# Patient Record
Sex: Male | Born: 1975 | Race: White | Hispanic: No | Marital: Married | Smoking: Never smoker
Health system: Southern US, Community
[De-identification: ages and names within clinical notes are randomized; demographics above are authoritative.]

---

## 2003-12-19 ENCOUNTER — Ambulatory Visit (HOSPITAL_COMMUNITY): Admission: RE | Admit: 2003-12-19 | Discharge: 2003-12-19 | Payer: Self-pay | Admitting: Internal Medicine

## 2004-06-13 ENCOUNTER — Ambulatory Visit (HOSPITAL_COMMUNITY): Admission: RE | Admit: 2004-06-13 | Discharge: 2004-06-13 | Payer: Self-pay | Admitting: Family Medicine

## 2008-03-30 ENCOUNTER — Emergency Department (HOSPITAL_COMMUNITY): Admission: EM | Admit: 2008-03-30 | Discharge: 2008-03-30 | Payer: Self-pay | Admitting: Emergency Medicine

## 2010-08-25 ENCOUNTER — Encounter: Payer: Self-pay | Admitting: Internal Medicine

## 2010-08-26 ENCOUNTER — Encounter: Payer: Self-pay | Admitting: Family Medicine

## 2012-12-30 ENCOUNTER — Telehealth: Payer: Self-pay

## 2012-12-30 NOTE — Telephone Encounter (Signed)
Denise from [facility] called stating that patient needs a signed order for the MRI of the right knee before they see him. Please fax it to [phone]. Thanks

## 2013-01-12 ENCOUNTER — Telehealth: Payer: Self-pay

## 2013-01-12 NOTE — Telephone Encounter (Signed)
Error! Placed message in [place] w/c

## 2018-01-04 ENCOUNTER — Emergency Department (HOSPITAL_COMMUNITY)
Admission: EM | Admit: 2018-01-04 | Discharge: 2018-01-05 | Disposition: A | Discharge status: Home / Self Care | Payer: BLUE CROSS/BLUE SHIELD | Attending: Emergency Medicine | Admitting: Emergency Medicine

## 2018-01-04 ENCOUNTER — Other Ambulatory Visit: Payer: Self-pay

## 2018-01-04 ENCOUNTER — Encounter (HOSPITAL_COMMUNITY): Payer: Self-pay | Admitting: Emergency Medicine

## 2018-01-04 DIAGNOSIS — S01112A Laceration without foreign body of left eyelid and periocular area, initial encounter: Secondary | ICD-10-CM

## 2018-01-04 DIAGNOSIS — W269XXA Contact with unspecified sharp object(s), initial encounter: Secondary | ICD-10-CM | POA: Diagnosis not present

## 2018-01-04 DIAGNOSIS — S0993XA Unspecified injury of face, initial encounter: Secondary | ICD-10-CM | POA: Diagnosis present

## 2018-01-04 DIAGNOSIS — Y939 Activity, unspecified: Secondary | ICD-10-CM | POA: Diagnosis not present

## 2018-01-04 DIAGNOSIS — Y999 Unspecified external cause status: Secondary | ICD-10-CM | POA: Diagnosis not present

## 2018-01-04 DIAGNOSIS — Y929 Unspecified place or not applicable: Secondary | ICD-10-CM | POA: Insufficient documentation

## 2018-01-04 NOTE — ED Triage Notes (Signed)
Laceration to lt eye lid, denies any visual loss.  Cut by a piece of electrical wire

## 2018-01-05 MED ORDER — LIDOCAINE-EPINEPHRINE-TETRACAINE (LET) SOLUTION
3.0000 mL | Freq: Once | NASAL | Status: AC
  Administered 2018-01-05: 3 mL via TOPICAL
  Filled 2018-01-05: qty 3

## 2018-01-05 MED ORDER — LIDOCAINE-EPINEPHRINE-TETRACAINE (LET) SOLUTION
NASAL | Status: AC
  Filled 2018-01-05: qty 3

## 2018-01-05 MED ORDER — LIDOCAINE HCL (PF) 2 % IJ SOLN
INTRAMUSCULAR | Status: AC
  Filled 2018-01-05: qty 10

## 2018-01-05 NOTE — ED Provider Notes (Signed)
[facility]ENCY DEPARTMENT Provider Note   CSN: [id number] Arrival date & time: 01/04/18  2030     History   Chief Complaint Chief Complaint  Patient presents with  . Laceration    HPI Andre Simon is a 42 y.o. male presenting with laceration to his mid left upper eyelid occurring prior to arrival.  He is an Personnel officerelectrician and describes a bare wire attached to a wall but protruding outward lacerated the eyelid when he walked too closely to the wire.  He denies eyeglobe injury, pain or reduced visual acuity.  The wound bled moderately but is now well controlled after pressure application.  He is current with his tetanus.  The history is provided by the patient.    History reviewed. No pertinent past medical history.  There are no active problems to display for this patient.   History reviewed. No pertinent surgical history.      Home Medications    Prior to Admission medications   Not on File    Family History No family history on file.  Social History Social History   Tobacco Use  . Smoking status: Never Smoker  . Smokeless tobacco: Never Used  Substance Use Topics  . Alcohol use: Not Currently  . Drug use: Not Currently     Allergies   Penicillins   Review of Systems Review of Systems  Constitutional: Negative.   Eyes: Negative for pain, redness and visual disturbance.  Skin: Positive for wound.  Neurological: Negative.   All other systems reviewed and are negative.    Physical Exam Updated Vital Signs BP 115/78 (BP Location: Right Arm)   Pulse 84   Resp 18   Ht 6' (1.829 m)   Wt 99.8 kg (220 lb)   SpO2 100%   BMI 29.84 kg/m   Physical Exam  Constitutional: He is oriented to person, place, and time. He appears well-developed and well-nourished.  HENT:  Head: Normocephalic.  Eyes: Pupils are equal, round, and reactive to light. Conjunctivae and EOM are normal.  1.5 cm linear laceration at the crease of his left upper eyelid.   Hemostatic.  Wound approximates well when open, but closure of the eyelid widens the wound edges.  Obicularis oris muscle visualized, but intact. No globe or eyelid margin injury.  Cardiovascular: Normal rate.  Pulmonary/Chest: Effort normal.  Musculoskeletal: He exhibits tenderness.  Neurological: He is alert and oriented to person, place, and time. No sensory deficit.  Skin: Laceration noted.     ED Treatments / Results  Labs (all labs ordered are listed, but only abnormal results are displayed) Labs Reviewed - No data to display  EKG None  Radiology No results found.  Procedures Procedures (including critical care time)  LACERATION REPAIR Performed by: Burgess AmorIDOL, Warden Buffa Authorized by: Burgess AmorIDOL, Annarose Ouellet Consent: Verbal consent obtained. Risks and benefits: risks, benefits and alternatives were discussed Consent given by: patient Patient identity confirmed: provided demographic data Prepped and Draped in normal sterile fashion Wound explored  Laceration Location: left upper eyelid  Laceration Length: 1.5cm  No Foreign Bodies seen or palpated  Anesthesia: Topical LET applied with only partial anesthesia obtained.  Attempted supraorbital nerve block with no improvement of pain relief.  Additional LET applied.    Local anesthetic: lidocaine 2% without epinephrine  Anesthetic total: 0.5 ml  Irrigation method: syringe Amount of cleaning: standard  Skin closure: vicryl Rapide 6-0  Number of sutures: 3  Technique: simple interrupted. Applied sutures to outer 1/2 of the wound which  adequately approximated the medial wound which remained sensate.  Patient tolerance: Patient tolerated the procedure well with no immediate complications.   Medications Ordered in ED Medications  lidocaine-EPINEPHrine-tetracaine (LET) solution (3 mLs Topical Given 01/05/18 0012)     Initial Impression / Assessment and Plan / ED Course  I have reviewed the triage vital signs and the nursing  notes.  Pertinent labs & imaging results that were available during my care of the patient were reviewed by me and considered in my medical decision making (see chart for details).     Wound care and f/u discussed. Prn removal of sutures in 5 days if pt desires. He is aware these are absorbable.  No globe injury or complaint of vision changes or deficit. Prn f/u anticipated.  Pt was seen by Dr. Bebe Shaggy during this encounter.  Final Clinical Impressions(s) / ED Diagnoses   Final diagnoses:  Left eyelid laceration, initial encounter    ED Discharge Orders    None       Victoriano Lain 01/05/18 1417    Zadie Rhine, MD 01/06/18 [phone]

## 2018-02-16 ENCOUNTER — Other Ambulatory Visit: Payer: Self-pay | Admitting: Family Medicine

## 2018-02-16 ENCOUNTER — Ambulatory Visit (HOSPITAL_COMMUNITY)
Admission: EM | Admit: 2018-02-16 | Discharge: 2018-02-16 | Discharge status: Absent without leave | Payer: BLUE CROSS/BLUE SHIELD

## 2018-02-16 ENCOUNTER — Ambulatory Visit: Payer: Self-pay

## 2018-02-16 DIAGNOSIS — M25571 Pain in right ankle and joints of right foot: Secondary | ICD-10-CM

## 2018-02-16 DIAGNOSIS — M25511 Pain in right shoulder: Secondary | ICD-10-CM

## 2019-05-30 IMAGING — DX DG SHOULDER 2+V*R*
4 series · 4 of 4 positions shown · non-contrast
Comparison: None.

CLINICAL DATA: 42-year-old who fell yesterday and injured the RIGHT
shoulder and the RIGHT ankle. Initial encounter.

EXAM:
RIGHT SHOULDER - 2+ VIEW

[shoulder ap (1 of 2)]
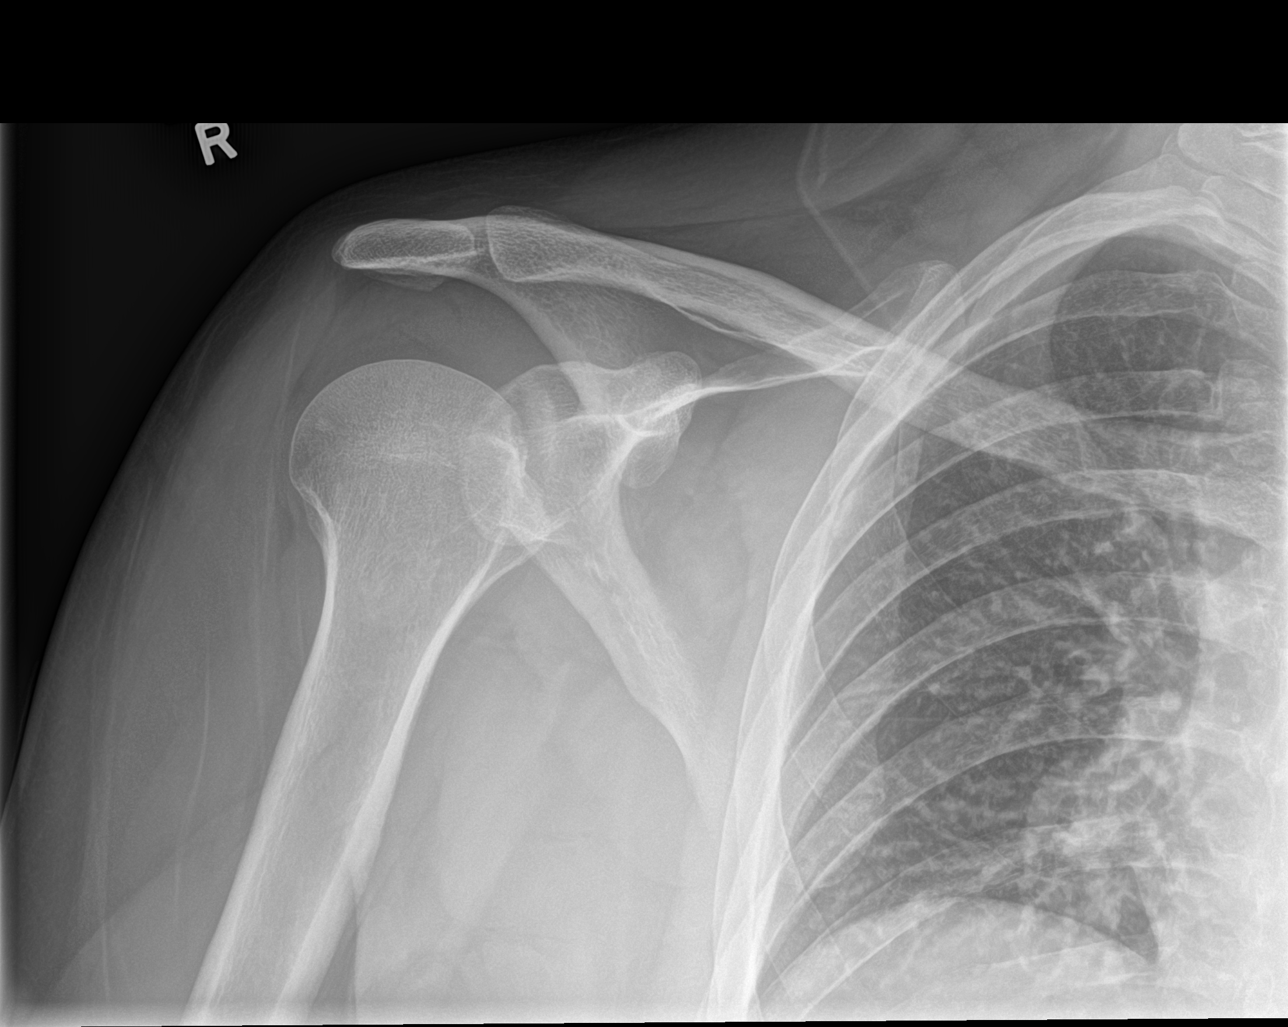

[shoulder ap (2 of 2)]
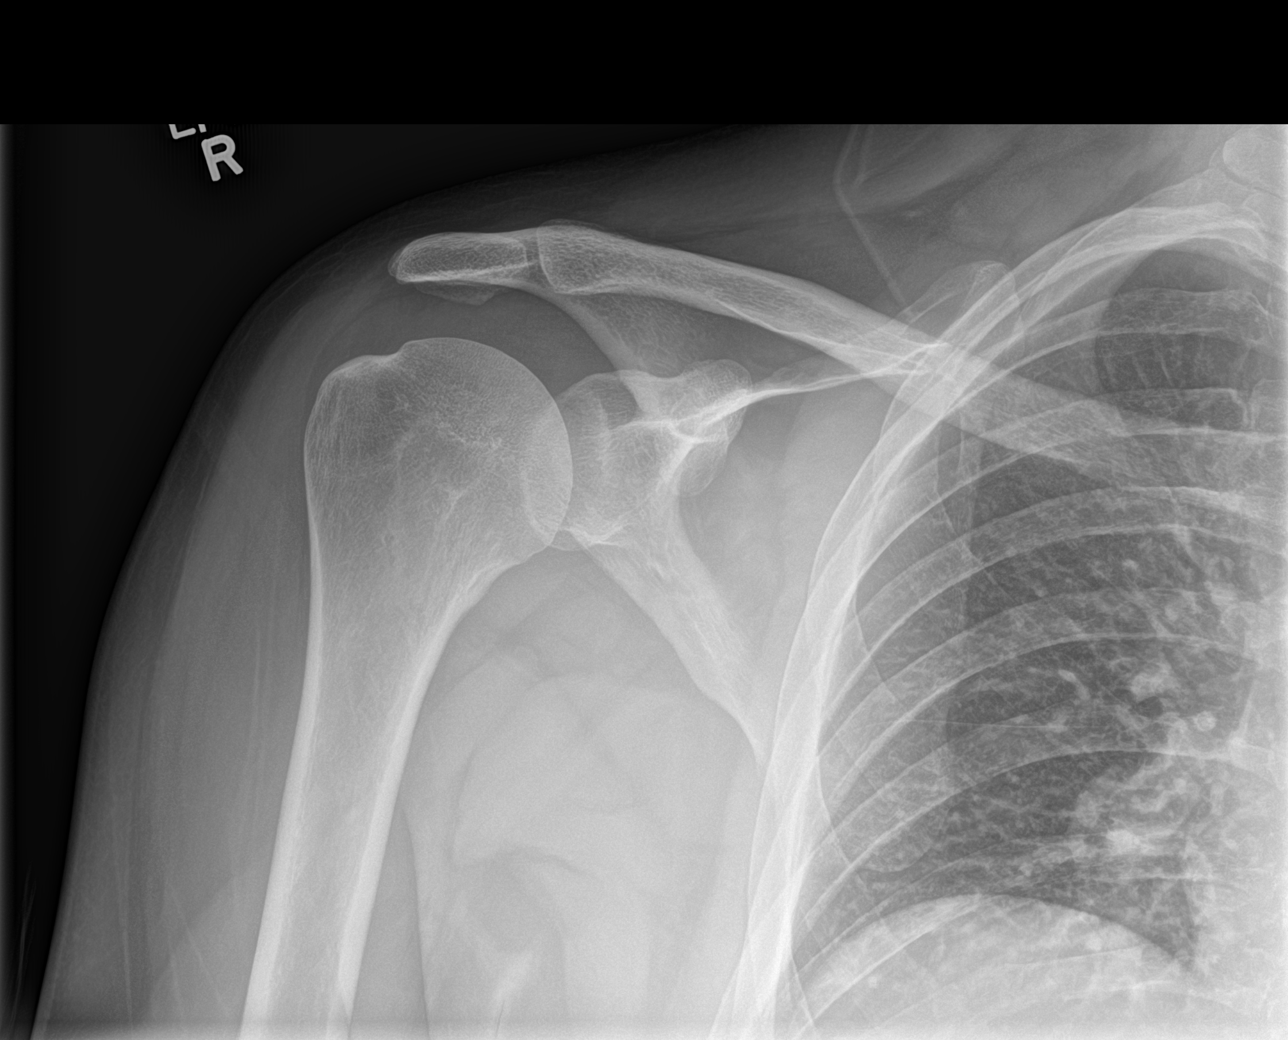

[shoulder y-view]
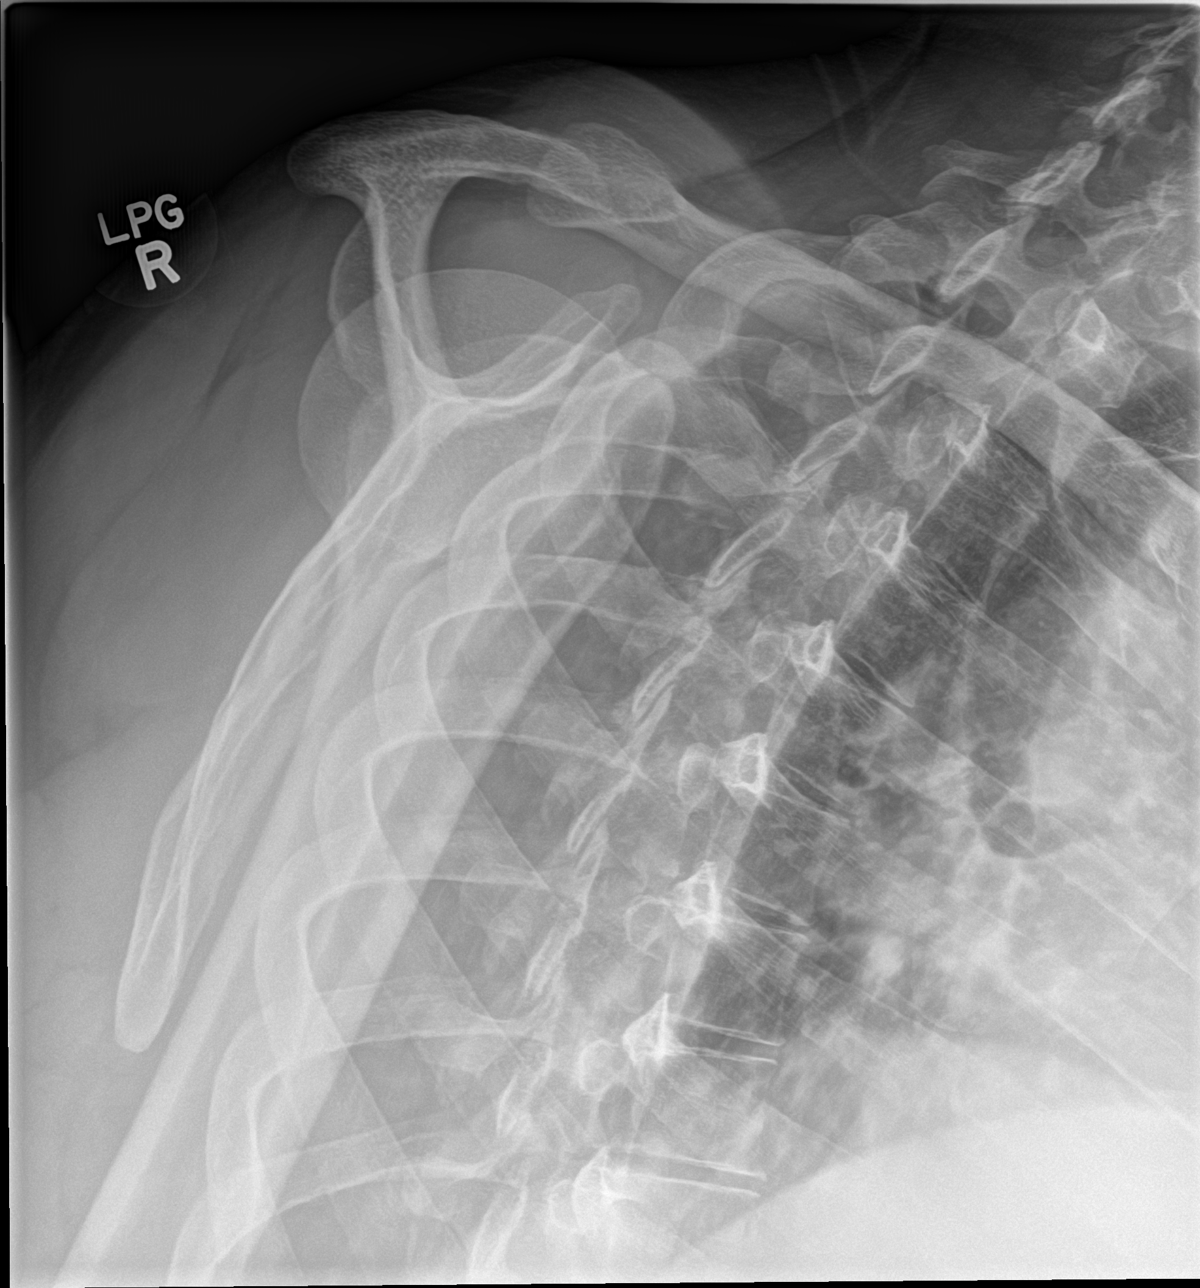

[shoulder axial]
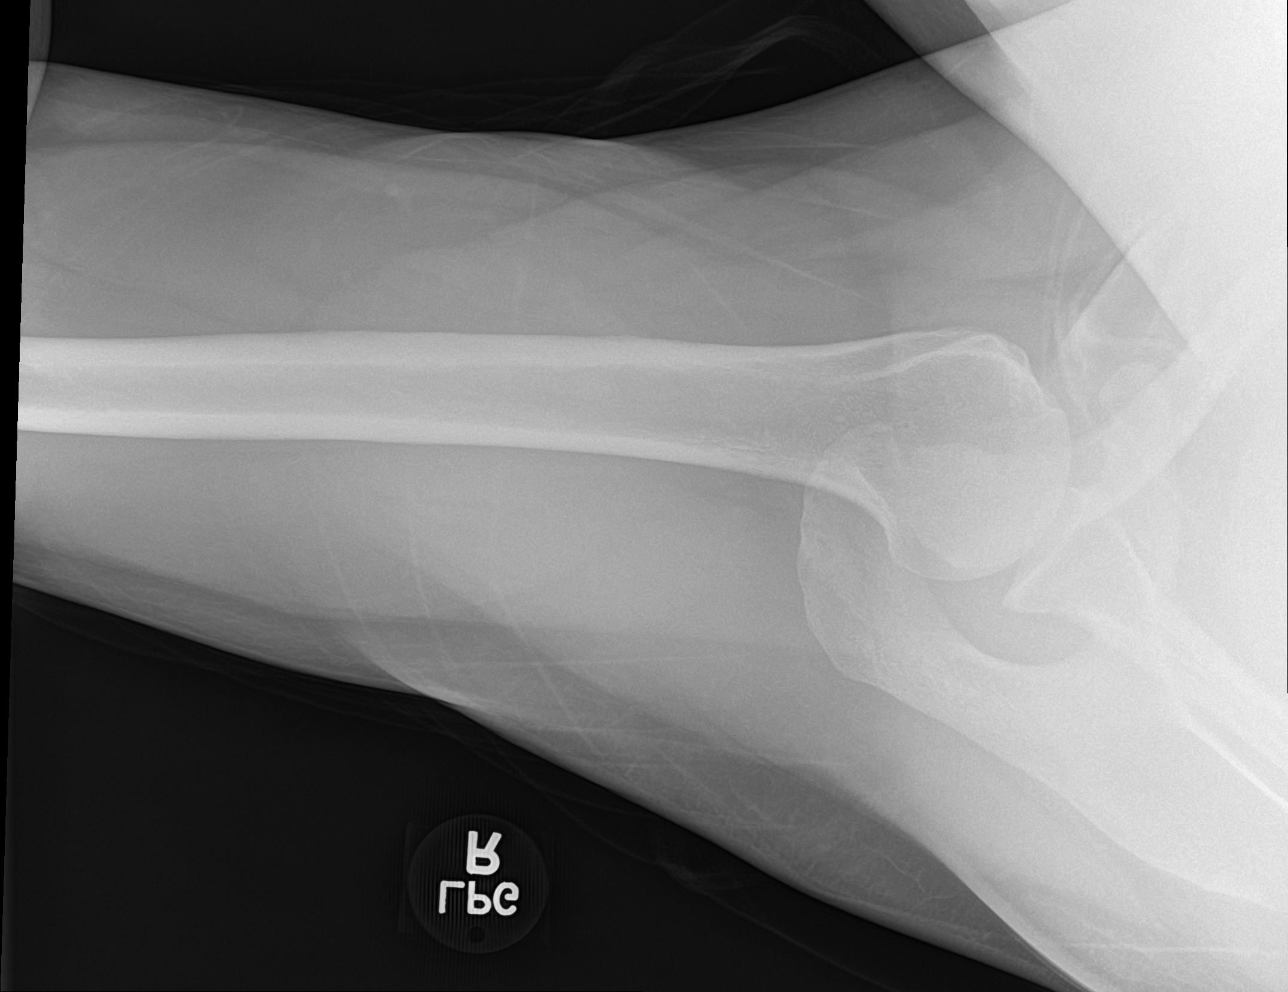

[4 of 4 positions shown; findings below may reference images not displayed]

FINDINGS: No acute fractures. Glenohumeral joint anatomically aligned with
well-preserved joint space. Joint effusion is suspected, accounting
for the increased subacromial space. Acromioclavicular joint intact
without degenerative changes. Bone mineral density well preserved.
IMPRESSION: 1. No acute osseous abnormality.
2. Joint effusion is suspected.

## 2019-05-30 IMAGING — DX DG ANKLE COMPLETE 3+V*R*
3 series · 3 of 3 positions shown · non-contrast
Comparison: None.

CLINICAL DATA: 42-year-old who fell yesterday and injured the RIGHT
shoulder and the RIGHT ankle. LATERAL ankle pain and swelling.
Initial encounter.

EXAM:
RIGHT ANKLE - COMPLETE 3+ VIEW

[ankle ap]
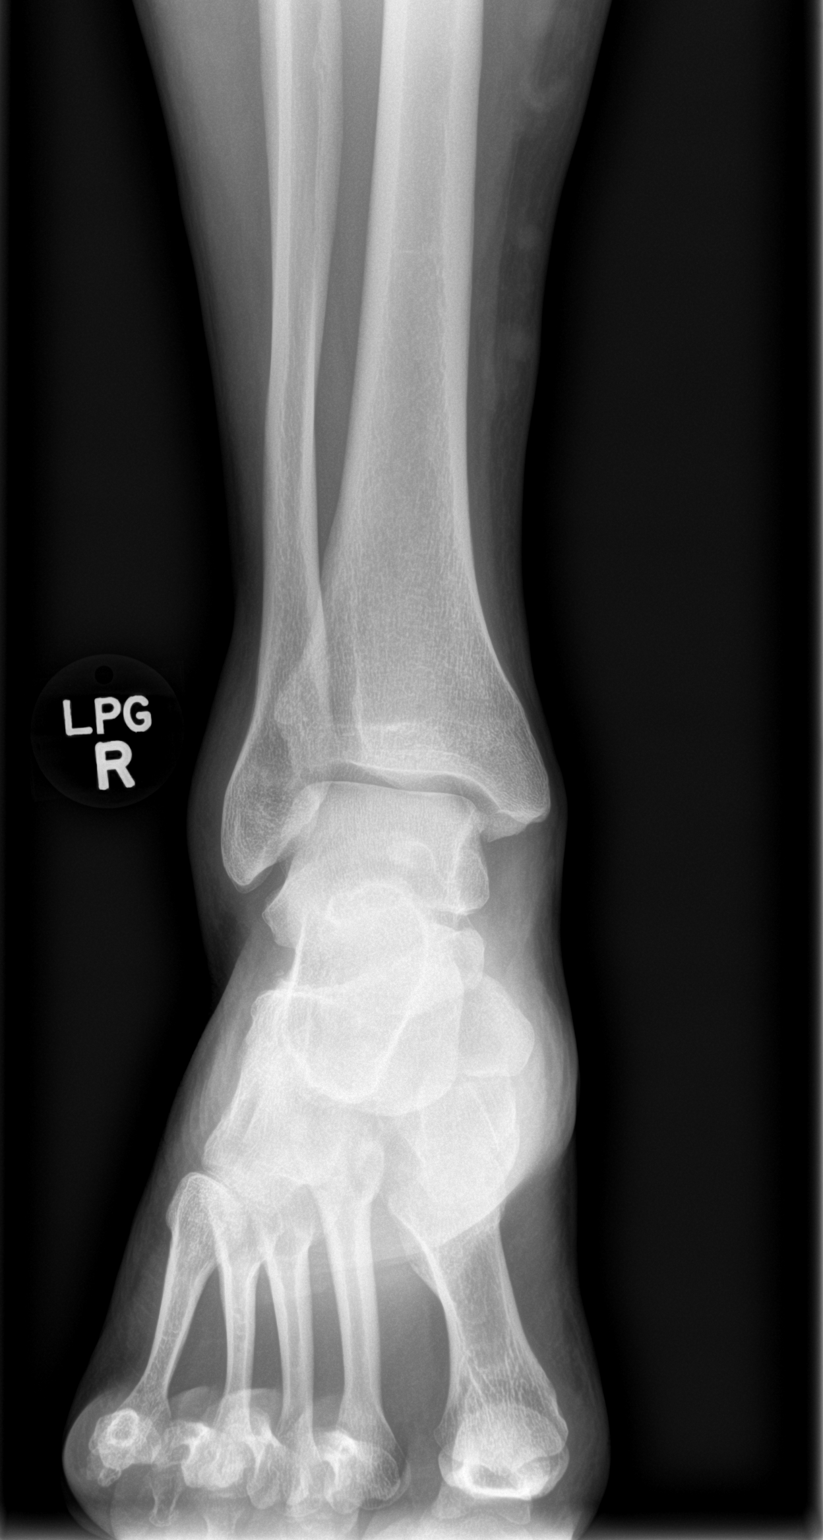

[ankle mortise]
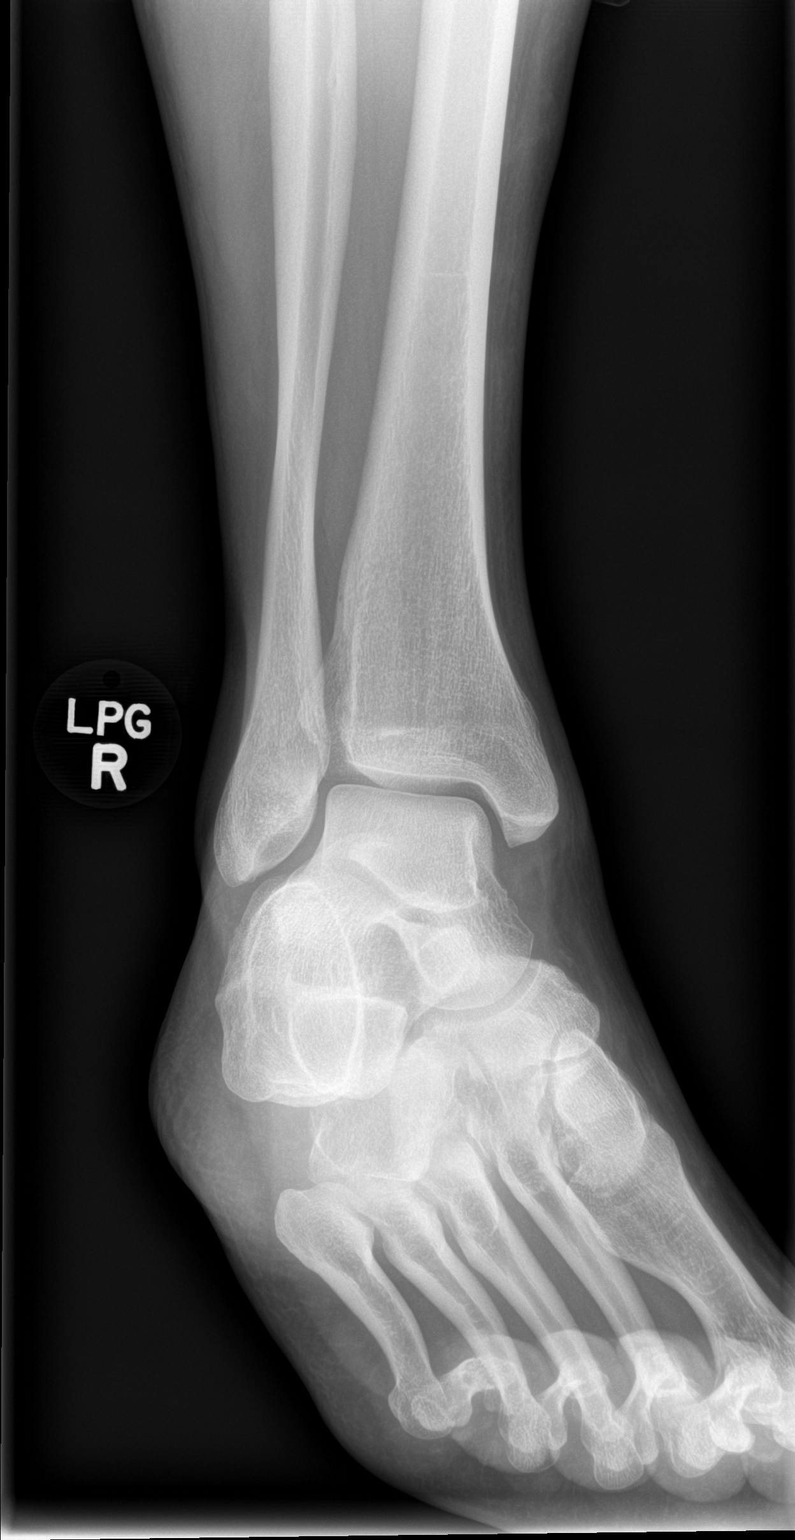

[ankle lat]
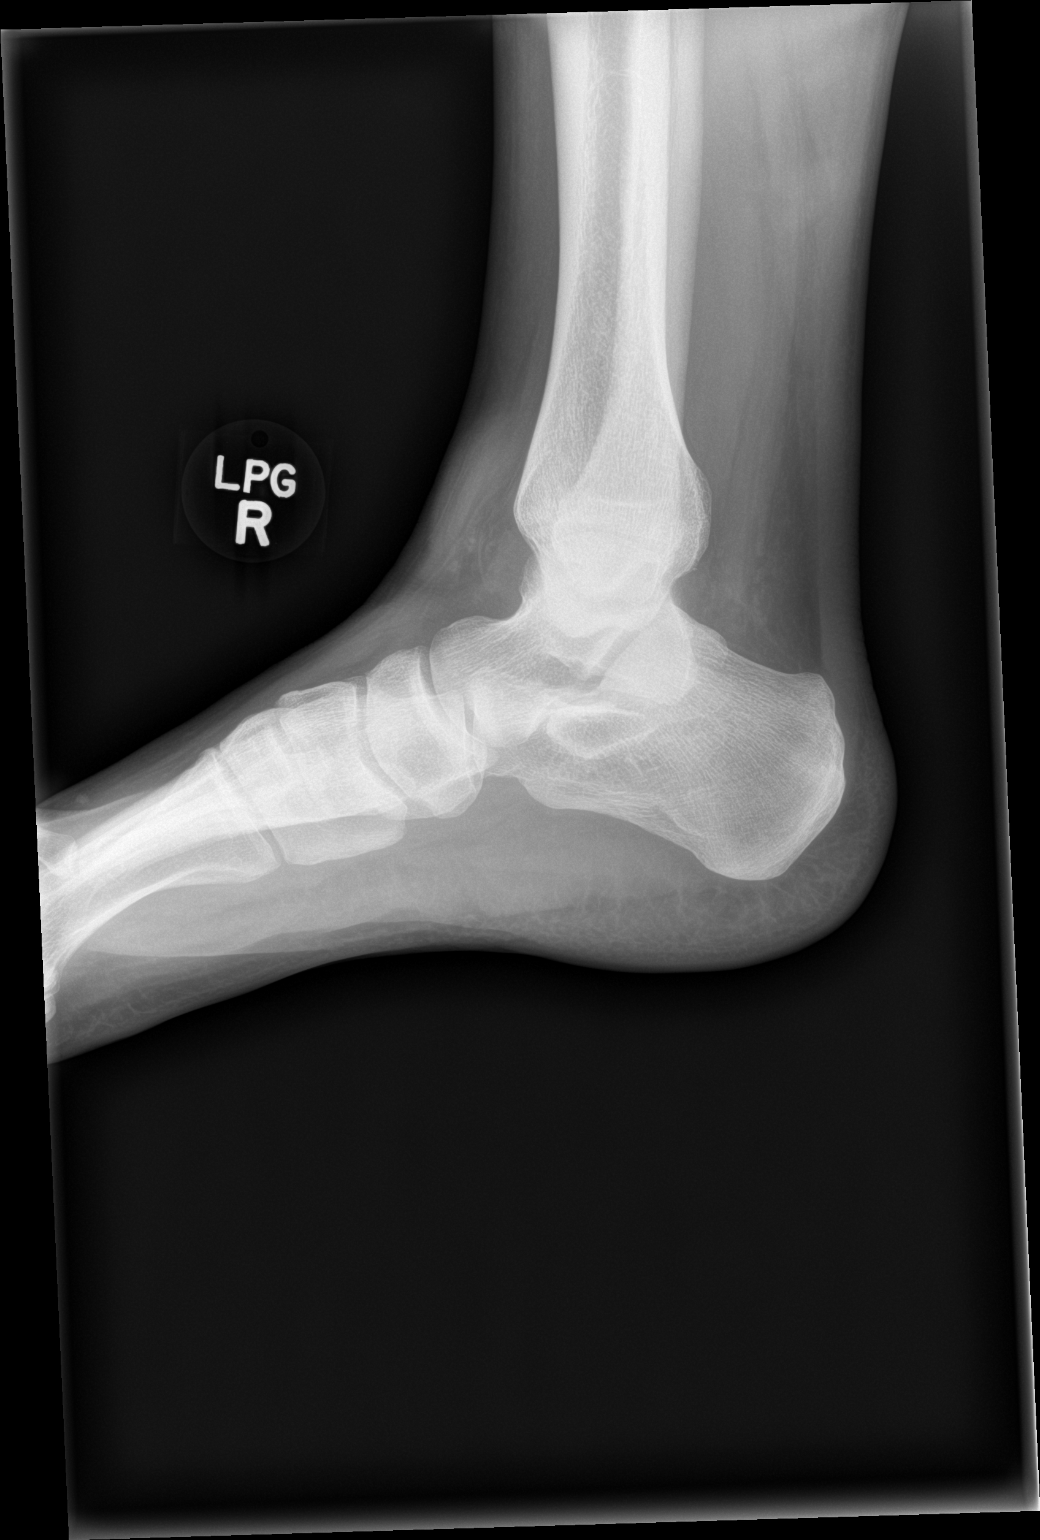

[3 of 3 positions shown; findings below may reference images not displayed]

FINDINGS: LATERAL soft tissue swelling. No evidence of acute fracture. Ankle
mortise intact with well-preserved joint space. Well-preserved bone
mineral density. No intrinsic osseous abnormalities. Joint effusion
suspected. Note is made prominent veins in the subcutaneous tissues
of the MEDIAL LOWER calf.
IMPRESSION: 1. No acute osseous abnormality.
2. Likely varicose veins involving the MEDIAL LOWER calf.

## 2024-02-16 ENCOUNTER — Other Ambulatory Visit: Payer: Self-pay | Admitting: Vascular Surgery

## 2024-02-16 DIAGNOSIS — M7989 Other specified soft tissue disorders: Secondary | ICD-10-CM

## 2024-03-01 ENCOUNTER — Ambulatory Visit (HOSPITAL_COMMUNITY)
Admission: RE | Admit: 2024-03-01 | Discharge: 2024-03-01 | Disposition: A | Discharge status: Home / Self Care | Payer: Self-pay | Source: Ambulatory Visit | Attending: Vascular Surgery | Admitting: Vascular Surgery

## 2024-03-01 DIAGNOSIS — M7989 Other specified soft tissue disorders: Secondary | ICD-10-CM | POA: Insufficient documentation

## 2024-03-28 ENCOUNTER — Ambulatory Visit: Attending: Surgery | Admitting: Physician Assistant

## 2024-03-28 VITALS — BP 121/77 | HR 77 | Temp 98.4°F | Wt 214.4 lb

## 2024-03-28 DIAGNOSIS — M7989 Other specified soft tissue disorders: Secondary | ICD-10-CM

## 2024-03-28 DIAGNOSIS — I8312 Varicose veins of left lower extremity with inflammation: Secondary | ICD-10-CM

## 2024-03-28 DIAGNOSIS — I872 Venous insufficiency (chronic) (peripheral): Secondary | ICD-10-CM

## 2024-03-28 DIAGNOSIS — I8311 Varicose veins of right lower extremity with inflammation: Secondary | ICD-10-CM | POA: Diagnosis not present

## 2024-03-28 NOTE — Progress Notes (Signed)
 Requested by:  No referring provider defined for this encounter.  Reason for consultation: painful bilateral lower extremity varicose veins in the thighs   History of Present Illness   Andre Simon is a 48 y.o. ([birth year]) male who presents for evaluation of bilateral varicose veins with pain. He explains that about a month ago the large varicose vein in his left thigh that he has had for many years became very hard, large, red and tender. It is now essentially resolved however about 2 weeks ago the same thing happened on the RLE. The right is still fairly large and tender but is improving. He denies any change in his daily routine. No long travel. 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 He says prior to this he has had these veins for many years and they have never bothered him. No aching, throbbing, itching, stinging, burning, swelling or bleeding. He does not regularly elevate and has never worn compression stockings before. He has no history of DVT  Venous symptoms include: superficial vein thrombosis.  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  No past surgical history on file.  Social History   Socioeconomic History   Marital status: Married    Spouse name: Not on file   Number of children: Not on file   Years of education: Not on file   Highest education level: Not on file  Occupational History   Not on file  Tobacco Use   Smoking status: Never   Smokeless tobacco: Never  Substance and Sexual Activity   Alcohol use: Not Currently   Drug use: Not Currently   Sexual activity: Not on file  Other Topics Concern   Not on file  Social History Narrative   Not  on file   Social Drivers of Health   Financial Resource Strain: Not on file  Food Insecurity: Not on file  Transportation Needs: Not on file  Physical Activity: Not on file  Stress: Not on file  Social Connections: Not on file  Intimate Partner Violence: Not on file   No family history on file.  Current Outpatient Medications  Medication Sig Dispense Refill   SKYRIZI 150 MG/ML SOSY prefilled syringe      No current facility-administered medications for this visit.    Allergies  Allergen Reactions   Penicillins     My mama told me not to take it so I dont     REVIEW OF SYSTEMS (negative unless checked):   Cardiac:  []  Chest pain or chest pressure? []  Shortness of breath upon activity? []  Shortness of breath when lying flat? []  Irregular heart rhythm?  Vascular:  []  Pain in calf, thigh, or hip brought on by walking? []  Pain in feet at night that wakes you up from your sleep? [x]  Blood clot in your veins? []  Leg swelling?  Pulmonary:  []  Oxygen at home? []  Productive cough? []  Wheezing?  Neurologic:  []  Sudden weakness in arms or legs? []  Sudden numbness in arms or legs? []  Sudden onset of difficult speaking or slurred speech? []  Temporary loss of vision in one eye? []  Problems with dizziness?  Gastrointestinal:  []   Blood in stool? []  Vomited blood?  Genitourinary:  []  Burning when urinating? []  Blood in urine?  Psychiatric:  []  Major depression  Hematologic:  []  Bleeding problems? []  Problems with blood clotting?  Dermatologic:  []  Rashes or ulcers?  Constitutional:  []  Fever or chills?  Ear/Nose/Throat:  []  Change in hearing? []  Nose bleeds? []  Sore throat?  Musculoskeletal:  []  Back pain? []  Joint pain? []  Muscle pain?   Physical Examination     Vitals:   03/28/24 1308  BP: 121/77  Pulse: 77  Temp: 98.4 F (36.9 C)  TempSrc: Temporal  Weight: 214 lb 6.4 oz (97.3 kg)   Body mass index is 29.08 kg/m.  General:  WDWN in  NAD; vital signs documented above Gait: Normal HENT: WNL, normocephalic Pulmonary: normal non-labored breathing Cardiac: regular HR Abdomen: soft Extremities: with multiple large varicose veins of bilateral thighs and lower legs. Thrombosis of bilateral medial posterior large varicose veins. Left resolving, Right still inflamed and slightly tender. without reticular veins, without edema, without stasis pigmentation, without lipodermatosclerosis, without ulcers Musculoskeletal: no muscle wasting or atrophy  Neurologic: A&O X 3;  No focal weakness or paresthesias are detected Psychiatric:  The pt has Normal affect.  Non-invasive Vascular Imaging   BLE Venous Insufficiency Duplex (03/01/24): LLE: No DVT and SVT,  GSV reflux mid thigh and prox calf GSV diameter  No SSV reflux 0.23-0.7 cm FV deep venous reflux Patient's area of concern is the posterior/medial mid thigh - prox calf. There is evidence of superificial thrombus in multiple varicosities from the mid thigh to prox calf. Please see noted reflux above.       Medical Decision Making   Andre Simon is a 48 y.o. male who presents with: LLE chronic venous insufficiency with superficial venous thrombosis of bilateral large varicosities in the thighs. This is essentially resolved on LLE and in process of resolving on right.  Duplex does not show any DVT or SVT.  GSV with minimal reflux only in mid high and calf. GSV is very small. Some deep reflux in FV. No SSV reflux. Superficial thrombus in multiple varicosities from the mid thigh to prox calf of LLE. Right lower extremity was not imaged today. Suspect similar findings based on history. Based on his duplex he is not a candidate for any venous intervention. Based on the patient's history and examination, I recommend: daily elevation of 20-30 minutes above level of heart, daily compression stocking use, exercise, weight reduction, refraining from prolonged sitting or standing I discussed  with the patient the use of  15-20 mm knee high compression stockings  Recommend warm compresses and NSAIDs as needed for pain He can follow up as needed if any new or worsening symptoms   Teretha Damme, PA-C [facility] Office: [phone]  03/28/2024, 1:35 PM  Clinic MD: [place]
